# Patient Record
Sex: Male | Born: 1960 | Hispanic: Yes | Marital: Married | Smoking: Former smoker
Health system: Southern US, Community
[De-identification: ages and names within clinical notes are randomized; demographics above are authoritative.]

---

## 2012-11-20 ENCOUNTER — Encounter: Payer: Self-pay | Admitting: Family Medicine

## 2012-11-20 ENCOUNTER — Ambulatory Visit (INDEPENDENT_AMBULATORY_CARE_PROVIDER_SITE_OTHER): Payer: Commercial Managed Care - PPO | Admitting: Family Medicine

## 2012-11-20 VITALS — BP 150/90 | HR 60 | Ht 64.5 in | Wt 169.0 lb

## 2012-11-20 DIAGNOSIS — IMO0001 Reserved for inherently not codable concepts without codable children: Secondary | ICD-10-CM

## 2012-11-20 DIAGNOSIS — E119 Type 2 diabetes mellitus without complications: Secondary | ICD-10-CM

## 2012-11-20 DIAGNOSIS — E118 Type 2 diabetes mellitus with unspecified complications: Secondary | ICD-10-CM | POA: Insufficient documentation

## 2012-11-20 DIAGNOSIS — I10 Essential (primary) hypertension: Secondary | ICD-10-CM | POA: Insufficient documentation

## 2012-11-20 MED ORDER — HYDROCHLOROTHIAZIDE 25 MG PO TABS: 25.0000 mg | ORAL_TABLET | Freq: Every day | ORAL | Status: DC

## 2012-11-20 MED ORDER — LISINOPRIL 40 MG PO TABS: 40.0000 mg | ORAL_TABLET | Freq: Every day | ORAL | Status: DC

## 2012-11-20 MED ORDER — ASPIRIN 81 MG PO CHEW: 81.0000 mg | CHEWABLE_TABLET | Freq: Every day | ORAL | Status: AC

## 2012-11-20 MED ORDER — METFORMIN HCL 850 MG PO TABS: 850.0000 mg | ORAL_TABLET | Freq: Two times a day (BID) | ORAL | Status: DC

## 2012-11-20 NOTE — Patient Instructions (Addendum)
Fue un Pharmacist, community.   [facility] mas tarde en esta semana: le llamo al [phone] o le mando una carta con los [place].  Medicinas recetadas hoy: (vea listado).  Tambien le recomiendo que tome una aspirina 81mg  una vez por dia, para protegerle al corazon.   Quiero volver a verle en 3 meses.    RELEASE OF INFORMATION FROM OPHTHALMOLOGIST IN [place], ALSO FROM PRIMARY PHYSICIANS DR. Doloris Hall AND DR. ROSALIA VASQUEZ.

## 2012-11-20 NOTE — Assessment & Plan Note (Signed)
To check labs, (metabolic profile, lipids and CBC).  Patient for increased dose of lisinopril from 20mg  to 40mg /day.  Recheck BP in 2-4 weeks.

## 2012-11-20 NOTE — Progress Notes (Signed)
  Subjective:    Patient ID: Noah Davidson, male    DOB: 04-14-1960, 52 y.o.   MRN: [id number]  HPI Visit with this new patient conducted in [place].  Patient recently moved to [place] from [place], [place], where he was under care for DM2, HTN and was seen by his ophthalmologist every 4 months for follow up of what he believes are eye problems related to his DM.   Was treated with metformin, HCTZ, lisinopril.  Ran out of medications for over 1 month.    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  Has not been under the care of a cardiologist.  Does not believe he has ever had cardiac catheterization.  Had ETT 2 yrs ago in [place] and reports he never got the results of the study.   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  Quit cigarettes 30 yrs ago. Reports drinking 2-3 beers a day, with heavier amounts (he states 6 beers in a sitting) on the weekends.  Reports that he does drink Gatorade frequently during hot weather; 2-3 tortillas/day, rarely eats rice or bread.  Enjoys vegetables and fresh fruits and eats regularly.    Review of Systems  Denies fevers or chills; reports "aires" which he describes as momentary chest pains that are sharp and are relieved by belching.  No dyspnea, no cough, no sputum production.  No dysuria, no abd pain, no nausea or vomiting or changes in stool habits.      Objective:   Physical Exam Well appearing, no apparent distress HEENT Neck supple. No cervical adenopathy. Clear oropharynx.  COR Regular S1S2, no extra sounds PULM CLear bilaterally, no rales or wheezes FEET: No lesions noted. Monofilament testing intact in all areas.  Palpable DP pulses bilaterally. No oedema.        Assessment & Plan:

## 2012-11-20 NOTE — Assessment & Plan Note (Signed)
A1C today is 13.1%.  Has been off his DM meds for some time.  To restart meds, emphasized diet and activity; recheck A1C in 3 months and proceed from there.  Also,ROI from [facility] doctors, including his Ophthalmologist who has been treating him for what sounds like diabetic retinopathy.

## 2012-11-23 ENCOUNTER — Other Ambulatory Visit: Payer: Commercial Managed Care - PPO

## 2012-11-23 LAB — LIPID PANEL
Cholesterol: 201 mg/dL — ABNORMAL HIGH (ref 0–200)
HDL: 56 mg/dL (ref >39)
LDL Cholesterol: 125 mg/dL — ABNORMAL HIGH (ref 0–99)
Triglycerides: 101 mg/dL (ref <150)

## 2012-11-23 LAB — COMPREHENSIVE METABOLIC PANEL
ALT: 15 U/L (ref 0–53)
BUN: 12 mg/dL (ref 6–23)
CO2: 31 mEq/L (ref 19–32)
Calcium: 9.7 mg/dL (ref 8.4–10.5)
Creat: 0.76 mg/dL (ref 0.50–1.35)
Glucose, Bld: 306 mg/dL — ABNORMAL HIGH (ref 70–99)
Total Bilirubin: 0.4 mg/dL (ref 0.3–1.2)

## 2012-11-23 LAB — CBC
HCT: 41.6 % (ref 39.0–52.0)
Hemoglobin: 14.4 g/dL (ref 13.0–17.0)
MCV: 85.2 fL (ref 78.0–100.0)
RBC: 4.88 MIL/uL (ref 4.22–5.81)
WBC: 4.6 10*3/uL (ref 4.0–10.5)

## 2012-11-23 NOTE — Progress Notes (Signed)
CMP,CBC AND FLP DONE TODAY Noah Davidson 

## 2012-11-26 ENCOUNTER — Encounter: Payer: Self-pay | Admitting: Family Medicine

## 2013-03-07 ENCOUNTER — Encounter: Payer: Self-pay | Admitting: Family Medicine

## 2013-03-07 ENCOUNTER — Ambulatory Visit (INDEPENDENT_AMBULATORY_CARE_PROVIDER_SITE_OTHER): Payer: Commercial Managed Care - PPO | Admitting: Family Medicine

## 2013-03-07 ENCOUNTER — Ambulatory Visit (HOSPITAL_COMMUNITY)
Admission: RE | Admit: 2013-03-07 | Discharge: 2013-03-07 | Disposition: A | Discharge status: Home / Self Care | Payer: Commercial Managed Care - PPO | Source: Ambulatory Visit | Attending: Family Medicine | Admitting: Family Medicine

## 2013-03-07 VITALS — BP 147/91 | HR 85 | Temp 98.0°F | Wt 170.0 lb

## 2013-03-07 DIAGNOSIS — M545 Low back pain, unspecified: Secondary | ICD-10-CM | POA: Insufficient documentation

## 2013-03-07 DIAGNOSIS — M5137 Other intervertebral disc degeneration, lumbosacral region: Secondary | ICD-10-CM | POA: Insufficient documentation

## 2013-03-07 DIAGNOSIS — G8929 Other chronic pain: Secondary | ICD-10-CM | POA: Insufficient documentation

## 2013-03-07 DIAGNOSIS — M51379 Other intervertebral disc degeneration, lumbosacral region without mention of lumbar back pain or lower extremity pain: Secondary | ICD-10-CM | POA: Insufficient documentation

## 2013-03-07 MED ORDER — NAPROXEN 500 MG PO TABS: 500.0000 mg | ORAL_TABLET | Freq: Three times a day (TID) | ORAL | Status: AC

## 2013-03-07 NOTE — Patient Instructions (Signed)
I am sorry your back pain is worse as well as the numbness in the legs and sharp pains in the feet. This could be a combination from your low back as well as your diabetes causing the sharp pain in your feet.   I gave you a medicine to try to relieve your pain, do not take ibuprofen with it. You can also try heat or ice.  We sent you for an x-ray.  We are sending you to a chiropractor but you are welcome to find one on your own if you would like and have them call us if they need the referral. You can also ask for a copy of your x-ray to take to the chiropractor.   I would like you to check in with Dr. Mauricio PoBreen in the next few weeks. You need to also have a visit for your diabetes and high blood pressure. I am not sure if he received records but check with him at that visit.  Dr. Durene CalHunter

## 2013-03-07 NOTE — Assessment & Plan Note (Addendum)
Acute flare after some housework yesterday. No obvious red flags for emergent evaluation (precepted with Dr. Gwendolyn GrantWalden). No falls but got x-ray to evaluate for compression fracture and there was none. Pain likely due to degenerative changes as well as suspected disc herniation given worse with sitting. Advised patient to use naproxen and f/u with Dr. Mauricio PoBreen in the next few weeks. ALso gave referral to chiropractor to see if could have some relief from manipulation (advised patient he could take x-ray report with him). Hopeful Dr. Mauricio PoBreen received records as previously planned.  Will call with x-ray results. Also advised f/u with Dr. Mauricio PoBreen for chornic issues including DM and hypertension.

## 2013-03-07 NOTE — Progress Notes (Signed)
Noah Conch, MD Phone: [phone]  Subjective:  Chief complaint-noted. Same Day Visit.   Low Back Pain  Pain rating, quality, Location-low back pain (more of a burning) shooting into legs. Associated with numbness throughout lower leg as well as stabbing pain in both feet. This pain is similar to previous occurences. Pain from 5-7/10. Not able to sleep at night.  Started, duration-has had intermittent pain for over 20 years, most recently flared up/reinjured in November 2013 at work and then yesterday after some housework symptoms seemed to worsen again.  Worse with -sitting makes the pain worse including numbness symptoms. Or with prolonged standing.  Relieved by-states cannot find position to make it better.  Though holding himself up slightly with armrests seems to relieve very slightly the pressure. Has tried ibuprofen with minimal relief. Lyrica did not help much in the past.  Previous Treatment-Had surgery in 1995 on L5 in [place]. Had physical therapy and was released in May 2014-no improvement in pain with that. Chiropractor with minimal help but states was mainly lifting weights and no manipulation. Steroid injections had helped in the past.  Previous imaging-before he moved, not in our system  ROS-No saddle anesthesia, bladder incontinence, fecal incontinence. One time yesterday pain was so severe that felt like legs were going to give out. History negative for trauma or falls, history of cancer, fever, chills, unintentional weight loss, recent bacterial infection, recent IV drug use, HIV, pain worse at night or while supine.   Past Medical History Patient Active Problem List   Diagnosis Date Noted  . Chronic low back pain 03/07/2013  . Type II or unspecified type diabetes mellitus without mention of complication, uncontrolled 11/20/2012  . Essential hypertension, benign 11/20/2012   Surgical history- as above  Medications- reviewed and updated Current Outpatient  Prescriptions  Medication Sig Dispense Refill  . aspirin (ASPIRIN CHILDRENS) 81 MG chewable tablet Chew 1 tablet (81 mg total) by mouth daily.  100 tablet  4  . hydrochlorothiazide (HYDRODIURIL) 25 MG tablet Take 1 tablet (25 mg total) by mouth daily.  90 tablet  3  . lisinopril (PRINIVIL,ZESTRIL) 40 MG tablet Take 1 tablet (40 mg total) by mouth daily.  90 tablet  3  . metFORMIN (GLUCOPHAGE) 850 MG tablet Take 1 tablet (850 mg total) by mouth 2 (two) times daily with a meal.  180 tablet  3  . naproxen (NAPROSYN) 500 MG tablet Take 1 tablet (500 mg total) by mouth 3 (three) times daily with meals.  60 tablet  1   No current facility-administered medications for this visit.    Objective: BP 147/91  Pulse 85  Temp(Src) 98 F (36.7 C) (Oral)  Wt 170 lb (77.111 kg) Gen: NAD, resting comfortably in chair with arms propped on arm rests.  Ext: no edema Skin: warm, dry Neuro: grossly normal, moves all extremities, 5/5 strength in lower extremities.   Back - Normal skin, Spine with normal alignment and no deformity. Midline scar noted. Tenderness to vertebral process palpation around L4-L6. Marland Kitchen  Paraspinous muscles are tender but without spasm.   Range of motion is full at neck. Patient due to pain is unable to touch toes or give lumbar extension except minimally. Negative straight leg raise.   Dg Lumbar Spine Complete  03/07/2013   CLINICAL DATA:  Chronic low back pain.  EXAM: LUMBAR SPINE - COMPLETE 4+ VIEW  COMPARISON:  None.  FINDINGS: Normal alignment of the lumbar spine. The vertebral body heights are well preserved. Disc space narrowing  stress set there is ventral endplate spur stress set mild ventral endplate spurring is noted throughout the lumbar spine. There is moderate disc space narrowing at the L5-S1 level.  IMPRESSION: 1. No acute findings. 2. L5-S1 degenerative disc disease.   Electronically Signed   By: Signa Kellaylor  Stroud M.D.   On: 03/07/2013 13:16   Assessment/Plan:  Chronic low back  pain Acute flare after some housework yesterday. No obvious red flags for emergent evaluation (precepted with Dr. Gwendolyn GrantWalden). No falls but got x-ray to evaluate for compression fracture and there was none. Pain likely due to degenerative changes as well as suspected disc herniation given worse with sitting. Advised patient to use naproxen and f/u with Dr. Mauricio PoBreen in the next few weeks. ALso gave referral to chiropractor to see if could have some relief from manipulation (advised patient he could take x-ray report with him). Hopeful Dr. Mauricio PoBreen received records as previously planned.  Will call with x-ray results. Also advised f/u with Dr. Mauricio PoBreen for chornic issues including DM and hypertension.     Orders Placed This Encounter  Procedures  . DG Lumbar Spine Complete    Standing Status: Future     Number of Occurrences: 1     Standing Expiration Date: 05/06/2014    Order Specific Question:  Reason for Exam (SYMPTOM  OR DIAGNOSIS REQUIRED)    Answer:  chronic low back pain. History of surgery in [place].    Order Specific Question:  Preferred imaging location?    Answer:  [facility]  . Ambulatory referral to Chiropractic    Referral Priority:  Routine    Referral Type:  Chiropractic    Referral Reason:  Specialty Services Required    Requested Specialty:  Chiropractic Medicine    Number of Visits Requested:  1    Meds ordered this encounter  Medications  . naproxen (NAPROSYN) 500 MG tablet    Sig: Take 1 tablet (500 mg total) by mouth 3 (three) times daily with meals.    Dispense:  60 tablet    Refill:  1

## 2013-03-08 ENCOUNTER — Telehealth: Payer: Self-pay | Admitting: *Deleted

## 2013-03-08 NOTE — Telephone Encounter (Signed)
Message copied by Osborne OmanFLEEGER, JESSICA D on Fri Mar 08, 2013  3:55 PM ------      Message from: Shelva MajesticHUNTER, STEPHEN O      Created: Fri Mar 08, 2013  2:39 PM       Blue team-LVM for patient to return call. Please give message if calls back: degenerative disc disease on L5-S1 and hardware in place. No acute changes. F/u with Dr. Mauricio PoBreen as planned. ------

## 2013-03-08 NOTE — Telephone Encounter (Signed)
Will await callback.  Pt has appt on 03/19/13. Noah Davidson, Noah Davidson

## 2013-03-11 NOTE — Telephone Encounter (Signed)
Pt informed. Noah Davidson Dawn  

## 2013-03-19 ENCOUNTER — Ambulatory Visit (INDEPENDENT_AMBULATORY_CARE_PROVIDER_SITE_OTHER): Payer: Commercial Managed Care - PPO | Admitting: Family Medicine

## 2013-03-19 VITALS — BP 144/90 | HR 88 | Ht 64.5 in | Wt 172.2 lb

## 2013-03-19 DIAGNOSIS — G8929 Other chronic pain: Secondary | ICD-10-CM

## 2013-03-19 DIAGNOSIS — IMO0001 Reserved for inherently not codable concepts without codable children: Secondary | ICD-10-CM

## 2013-03-19 DIAGNOSIS — I10 Essential (primary) hypertension: Secondary | ICD-10-CM

## 2013-03-19 DIAGNOSIS — IMO0002 Reserved for concepts with insufficient information to code with codable children: Secondary | ICD-10-CM

## 2013-03-19 DIAGNOSIS — E1165 Type 2 diabetes mellitus with hyperglycemia: Principal | ICD-10-CM

## 2013-03-19 DIAGNOSIS — M545 Low back pain, unspecified: Secondary | ICD-10-CM

## 2013-03-19 DIAGNOSIS — M5416 Radiculopathy, lumbar region: Secondary | ICD-10-CM | POA: Insufficient documentation

## 2013-03-19 LAB — BASIC METABOLIC PANEL
BUN: 10 mg/dL (ref 6–23)
CO2: 29 meq/L (ref 19–32)
Calcium: 9.2 mg/dL (ref 8.4–10.5)
Chloride: 98 mEq/L (ref 96–112)
Creat: 0.7 mg/dL (ref 0.50–1.35)
GLUCOSE: 248 mg/dL — AB (ref 70–99)
POTASSIUM: 4 meq/L (ref 3.5–5.3)
Sodium: 136 mEq/L (ref 135–145)

## 2013-03-19 LAB — POCT GLYCOSYLATED HEMOGLOBIN (HGB A1C): Hemoglobin A1C: 8.5

## 2013-03-19 MED ORDER — GABAPENTIN 300 MG PO CAPS: 300.0000 mg | ORAL_CAPSULE | Freq: Three times a day (TID) | ORAL | Status: DC

## 2013-03-19 NOTE — Patient Instructions (Addendum)
Fue un Research officer, trade unionplacer verle hoy.  Estoy ordenando otro chequeo del Chief Financial Officerazucar y de la funcion renal hoy; Garment/textile technologistle contacto con los [place].   Estoy haciendo una orden para la resonancia magnetica de la espalda; estamos comenzando una nueva medicina GABAPENTIN 300mg , tome una capsula por boca cada noche por una semana, despues aumente a 1 capsula manana y noche, y en la tercera semana 1 capsula tres veces por dia (manana, mediodia y noche).  Quiero volver a verle entre 4 y 6 semanas para ver si esta' mejor del dolor en las piernas.   MAKE FOLLOW UP APPOINTMENT WITH DR Mauricio PoBREEN IN 4 TO 6 WEEKS.

## 2013-03-20 ENCOUNTER — Encounter: Payer: Self-pay | Admitting: Family Medicine

## 2013-03-20 ENCOUNTER — Telehealth: Payer: Self-pay | Admitting: Family Medicine

## 2013-03-20 NOTE — Assessment & Plan Note (Signed)
Plan to pursue additional imaging given patient's complex history of lumbar injury and surgery; will also start on gabapentin for element of neuropathic pain that may be partially DM related as well. Discussed escalating up on the gabapentin, follow up to reassess.
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
## 2013-03-20 NOTE — Assessment & Plan Note (Signed)
To reassess A1C at today's visit, decide on need for more aggressive medical therapy and continued dietary intervention.

## 2013-03-20 NOTE — Telephone Encounter (Signed)
I tried to call patient at the only number on chart (602 area code), no answer.  Routine results letter sent.  JB

## 2013-03-29 ENCOUNTER — Ambulatory Visit (HOSPITAL_COMMUNITY)
Admission: RE | Admit: 2013-03-29 | Discharge: 2013-03-29 | Disposition: A | Discharge status: Home / Self Care | Payer: Commercial Managed Care - PPO | Source: Ambulatory Visit | Attending: Family Medicine | Admitting: Family Medicine

## 2013-03-29 DIAGNOSIS — M5416 Radiculopathy, lumbar region: Secondary | ICD-10-CM

## 2013-03-29 DIAGNOSIS — M5137 Other intervertebral disc degeneration, lumbosacral region: Secondary | ICD-10-CM | POA: Insufficient documentation

## 2013-03-29 DIAGNOSIS — M51379 Other intervertebral disc degeneration, lumbosacral region without mention of lumbar back pain or lower extremity pain: Secondary | ICD-10-CM | POA: Insufficient documentation

## 2013-03-29 DIAGNOSIS — M5126 Other intervertebral disc displacement, lumbar region: Secondary | ICD-10-CM | POA: Insufficient documentation

## 2013-04-04 ENCOUNTER — Encounter: Payer: Self-pay | Admitting: Family Medicine

## 2013-04-09 ENCOUNTER — Ambulatory Visit (INDEPENDENT_AMBULATORY_CARE_PROVIDER_SITE_OTHER): Payer: Commercial Managed Care - PPO | Admitting: Family Medicine

## 2013-04-09 ENCOUNTER — Encounter: Payer: Self-pay | Admitting: Family Medicine

## 2013-04-09 VITALS — BP 134/80 | HR 80 | Ht 64.5 in | Wt 177.2 lb

## 2013-04-09 DIAGNOSIS — IMO0002 Reserved for concepts with insufficient information to code with codable children: Secondary | ICD-10-CM

## 2013-04-09 DIAGNOSIS — M545 Low back pain, unspecified: Secondary | ICD-10-CM

## 2013-04-09 DIAGNOSIS — M5416 Radiculopathy, lumbar region: Secondary | ICD-10-CM

## 2013-04-09 DIAGNOSIS — G8929 Other chronic pain: Secondary | ICD-10-CM

## 2013-04-09 MED ORDER — GABAPENTIN 300 MG PO CAPS: 600.0000 mg | ORAL_CAPSULE | Freq: Three times a day (TID) | ORAL | Status: DC

## 2013-04-09 NOTE — Patient Instructions (Signed)
Fue un Research officer, trade unionplacer verle hoy.  Como hablamos, estoy refiriendole a un neurocirujano.  En el mientrastanto, quiero que aumente la GABAPENTIN 300mg  a 2 capsulas tres veces por dia.   Quiero volver a verle en 2 a 4 semanas.  En esa visita vamos a hablar mas sobre la depresion.   FOLLOW UP WITH DR Mauricio PoBREEN IN 2 TO 4 WEEKS, BACK PAIN AND DEPRESSION.

## 2013-04-10 NOTE — Assessment & Plan Note (Signed)
Discussed findings of MRI, recommendation to see neurosurgery.  He has some improvement in pain symptoms with gabapentin, will increase dose to 600mg  tid and follow up in 4 weeks.  Plan to discuss depressive symptoms at that visit, as he and friend bring this up at the very end of this visit.
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
## 2013-05-07 ENCOUNTER — Ambulatory Visit: Payer: Commercial Managed Care - PPO | Admitting: Family Medicine

## 2013-05-10 ENCOUNTER — Ambulatory Visit (INDEPENDENT_AMBULATORY_CARE_PROVIDER_SITE_OTHER): Payer: Commercial Managed Care - PPO | Admitting: Family Medicine

## 2013-05-10 ENCOUNTER — Encounter: Payer: Self-pay | Admitting: Family Medicine

## 2013-05-10 VITALS — BP 140/90 | HR 74 | Temp 98.6°F | Ht 64.5 in | Wt 178.3 lb

## 2013-05-10 DIAGNOSIS — IMO0001 Reserved for inherently not codable concepts without codable children: Secondary | ICD-10-CM

## 2013-05-10 DIAGNOSIS — E1169 Type 2 diabetes mellitus with other specified complication: Secondary | ICD-10-CM

## 2013-05-10 DIAGNOSIS — M545 Low back pain, unspecified: Secondary | ICD-10-CM

## 2013-05-10 DIAGNOSIS — G8929 Other chronic pain: Secondary | ICD-10-CM

## 2013-05-10 DIAGNOSIS — E1165 Type 2 diabetes mellitus with hyperglycemia: Secondary | ICD-10-CM

## 2013-05-10 DIAGNOSIS — L255 Unspecified contact dermatitis due to plants, except food: Secondary | ICD-10-CM

## 2013-05-10 DIAGNOSIS — N521 Erectile dysfunction due to diseases classified elsewhere: Secondary | ICD-10-CM

## 2013-05-10 DIAGNOSIS — N529 Male erectile dysfunction, unspecified: Secondary | ICD-10-CM

## 2013-05-10 DIAGNOSIS — L237 Allergic contact dermatitis due to plants, except food: Secondary | ICD-10-CM

## 2013-05-10 MED ORDER — SILDENAFIL CITRATE 20 MG PO TABS: 20.0000 mg | ORAL_TABLET | Freq: Every day | ORAL | Status: DC | PRN

## 2013-05-10 MED ORDER — GABAPENTIN 300 MG PO CAPS: 900.0000 mg | ORAL_CAPSULE | Freq: Three times a day (TID) | ORAL | Status: DC

## 2013-05-10 MED ORDER — TRIAMCINOLONE ACETONIDE 0.1 % EX CREA: 1.0000 "application " | TOPICAL_CREAM | Freq: Two times a day (BID) | CUTANEOUS | Status: DC

## 2013-05-10 NOTE — Patient Instructions (Signed)
Fue un Research officer, trade unionplacer verle hoy.    1. Aumentamos la dosis de la gabapentin a 900mg , tres veces por dia (3 capsulas cada dosis).  2. Para la hiedra venenosa, Kenalog pomada 2 veces por dia.  3. Para la disfuncion sexual, sildenafil 20mg , media-hora antes de cuando anticipa la actividad sexual.   Noah NevinsQuiero volver a verle en 1-2 meses, despues de su proxima visita al cirujano.

## 2013-05-10 NOTE — Assessment & Plan Note (Signed)
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  No diagnosis of CVD; trial of low-dose sildenafil and follow up in 1-2 month. Discussed side effects that shouuld prompt immediate discontinuation. Not on an alpha blocker or nitrate.

## 2013-05-10 NOTE — Progress Notes (Signed)
   Subjective:    Patient ID: Noah Davidson, male    DOB: 1960-10-28, 53 y.o.   MRN: [id number]  HPI Visit in Spanish. Patient for follow up of a few items, plus 2 new complaints.  Went to neurosurgery, I received a letter from them and plan for more conservative measures.  The 6-day steroid (oral) taper was helpful in the short-term; the pain in the low back and legs has resumed and inhibits his sleep.  He believes the gabapentin has helped somewhat, still with significant "hormigueo" (tingling) over the legs and to tips of all 10 toes.  No re-injury.  Was in the yard on Sunday, March 8th and had eruption along the medial aspect of upper L arm.  Not sure how he got it, suspected it was from biting insects.  No fevers/chills, no home remedies to this point.   Reports difficulty achieving erection over the past 7-8 months.  Absence of morning spontaneous erection that he used to have when he would wake up.  Sexually active with male partner, relationship is good and doesn't believe there's a mental or emotional component.  No other meds than those on his med list.  ROS: No chest pain or pressure, no dyspnea, no history of CVD in the past.      Review of Systems     Objective:   Physical Exam Well appearing, no apparent distress HEENT Neck supple.  COR Regular S1S2, no extra sounds MSK: No edema in ankles or feet; palpable dp pulses. Sensation with monofilament is intact and symmetric in both feet. Brisk cap refill in all toes. NO skin changes or maceration between toes. Able to stand on heels and toes without difficulty. Patellar DTRs 1+ symmetrically.        Assessment & Plan:

## 2013-05-10 NOTE — Assessment & Plan Note (Signed)
On exam, red macules that streak across arm, with large area along medial aspect of R arm that has been excoriated and is very erythematous.  No fluctuance or purulence.  Clinically consistent with poison ivy, to use topical steroids for 1 week and report if worsening, painful or purulence.

## 2013-05-10 NOTE — Assessment & Plan Note (Signed)
Improved control at last visit. For A1C again at next visit in 1-2 months. Had flu shot this season.

## 2013-05-13 ENCOUNTER — Other Ambulatory Visit: Payer: Self-pay | Admitting: *Deleted

## 2013-05-13 DIAGNOSIS — E1169 Type 2 diabetes mellitus with other specified complication: Secondary | ICD-10-CM

## 2013-05-13 DIAGNOSIS — N521 Erectile dysfunction due to diseases classified elsewhere: Principal | ICD-10-CM

## 2013-05-13 MED ORDER — SILDENAFIL CITRATE 20 MG PO TABS: 20.0000 mg | ORAL_TABLET | Freq: Every day | ORAL | Status: DC | PRN

## 2013-06-18 ENCOUNTER — Ambulatory Visit: Payer: Commercial Managed Care - PPO | Admitting: Family Medicine

## 2013-07-02 ENCOUNTER — Ambulatory Visit (INDEPENDENT_AMBULATORY_CARE_PROVIDER_SITE_OTHER): Payer: Commercial Managed Care - PPO | Admitting: Family Medicine

## 2013-07-02 ENCOUNTER — Encounter: Payer: Self-pay | Admitting: Family Medicine

## 2013-07-02 VITALS — BP 152/96 | HR 76 | Ht 64.5 in | Wt 178.5 lb

## 2013-07-02 DIAGNOSIS — N521 Erectile dysfunction due to diseases classified elsewhere: Secondary | ICD-10-CM

## 2013-07-02 DIAGNOSIS — E1169 Type 2 diabetes mellitus with other specified complication: Secondary | ICD-10-CM

## 2013-07-02 DIAGNOSIS — E1165 Type 2 diabetes mellitus with hyperglycemia: Principal | ICD-10-CM

## 2013-07-02 DIAGNOSIS — M25511 Pain in right shoulder: Secondary | ICD-10-CM | POA: Insufficient documentation

## 2013-07-02 DIAGNOSIS — M25512 Pain in left shoulder: Secondary | ICD-10-CM

## 2013-07-02 DIAGNOSIS — R059 Cough, unspecified: Secondary | ICD-10-CM

## 2013-07-02 DIAGNOSIS — I10 Essential (primary) hypertension: Secondary | ICD-10-CM

## 2013-07-02 DIAGNOSIS — R05 Cough: Secondary | ICD-10-CM

## 2013-07-02 DIAGNOSIS — IMO0001 Reserved for inherently not codable concepts without codable children: Secondary | ICD-10-CM

## 2013-07-02 DIAGNOSIS — G8929 Other chronic pain: Secondary | ICD-10-CM

## 2013-07-02 DIAGNOSIS — N529 Male erectile dysfunction, unspecified: Secondary | ICD-10-CM

## 2013-07-02 DIAGNOSIS — M545 Low back pain, unspecified: Secondary | ICD-10-CM

## 2013-07-02 DIAGNOSIS — M25519 Pain in unspecified shoulder: Secondary | ICD-10-CM

## 2013-07-02 LAB — POCT GLYCOSYLATED HEMOGLOBIN (HGB A1C): Hemoglobin A1C: 7.4

## 2013-07-02 MED ORDER — AMLODIPINE BESYLATE 5 MG PO TABS: 5.0000 mg | ORAL_TABLET | Freq: Every day | ORAL | Status: DC

## 2013-07-02 MED ORDER — GABAPENTIN 300 MG PO CAPS: 900.0000 mg | ORAL_CAPSULE | Freq: Three times a day (TID) | ORAL | Status: DC

## 2013-07-02 NOTE — Patient Instructions (Addendum)
Fue un Research officer, trade unionplacer verle hoy.   Para la presion arterial alta, le estoy agregando otra medicina para la presion:  AMLODIPINE 5mg , una tableta por la manana, JUNTA CON LA LISINOPRIL y la HCTZ.  Aspirina 81mg  diario.  Para el hormigueo de las manos y los pies, tome la GABAPENTIN 300mg , UNA CAPSULA TRES VECES POR DIA.  Es importante seguimento con el oftalmologo para los cambios de la vista.   Para la tos, una placa (tambien de los hombros)  Tourist information centre managerQuiero volver a verle en 4 semanas; podemos considerar la opcion de inyectarle los hombros en esa visita.   FOLLOW UP WITH DR Graysen Woodyard 4 TO 5 WEEKS; SHOULDER INJECTION.

## 2013-07-03 NOTE — Assessment & Plan Note (Signed)
Suspect impingement, related to work.  Exam with pain with internal rotation more in R shoulder than L.  Xrays, consider subacromial inj afterward.

## 2013-07-03 NOTE — Assessment & Plan Note (Signed)
Not well controlled on HCTZ and lisinopril.  Addition of CCB to regimen, recheck in 2-4 weeks.

## 2013-07-03 NOTE — Assessment & Plan Note (Signed)
Tried Revatio one time; did not work.  Not taking. Discontinued from med list.

## 2013-07-03 NOTE — Progress Notes (Signed)
   Subjective:    Patient ID: Noah Davidson, male    DOB: 03-Nov-1960, 53 y.o.   MRN: [id number]  HPI Visit conducted in Spanish; here for follow up of many issues:  1. 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  A1C today has come down considerably (to 7.4%, down from 8.5% in Jan).  Has continued with pins and needles sensation in feet; taking gabapentin 300mg  twice daily.  Also some tingling in hands bilaterally.   2. HTN_ not well controlled, on lisinopril and HCTZ. Taking these daily, did not take yet today. Denies chest pain or pressure, no dyspnea.  Former smoker (quit in the distant past).   3. 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  No episode of traumatic injury.     Review of Systems     Objective:   Physical Exam Well appearing, no apparent distress HEENT Neck supple, no cervical adenopathy.  MSK: Positive Neer/Hawkins tests on R shoulder.  Handgrip symmetric and full; negative tinnels testing bilat wrists.  COR Regular S1S2 PULM Clear bilaterally, no rales or wheezes EXTS: no edema; callous along base of both great toes       Assessment & Plan:

## 2013-07-03 NOTE — Assessment & Plan Note (Addendum)
Improved  Control in the past 4 months. Continue current management.  Of note, patient reports worsening blurred vision, had gone to ophthalmology and had laser treatment. Has not gone back because he owes a bill of copay that is not covered by insurance and which he cannot pay now. He is advised to return to ophthalmology for further assessment and treatment of his blurred vision, which most likely is a manifestation of his retinopathy, DM related.

## 2013-07-25 ENCOUNTER — Other Ambulatory Visit: Payer: Self-pay | Admitting: Family Medicine

## 2013-07-30 ENCOUNTER — Ambulatory Visit (INDEPENDENT_AMBULATORY_CARE_PROVIDER_SITE_OTHER): Payer: Commercial Managed Care - PPO | Admitting: Family Medicine

## 2013-07-30 ENCOUNTER — Encounter: Payer: Self-pay | Admitting: Family Medicine

## 2013-07-30 VITALS — BP 150/96 | HR 82 | Ht 64.5 in | Wt 182.0 lb

## 2013-07-30 DIAGNOSIS — M25511 Pain in right shoulder: Secondary | ICD-10-CM

## 2013-07-30 DIAGNOSIS — M25519 Pain in unspecified shoulder: Secondary | ICD-10-CM

## 2013-07-30 DIAGNOSIS — M25512 Pain in left shoulder: Principal | ICD-10-CM

## 2013-07-30 MED ORDER — METHYLPREDNISOLONE ACETATE 40 MG/ML IJ SUSP
40.0000 mg | Freq: Once | INTRAMUSCULAR | Status: AC
  Administered 2013-07-30: 40 mg via INTRA_ARTICULAR

## 2013-07-30 NOTE — Patient Instructions (Signed)
Fue un Research officer, trade union.  Si el dolor del hombro no se mejora despues de la inyeccion de [place], quiero que vaya a Development worker, community las placas que ordene' en su ultima cita.   Quiero volver a verle al comienzo de Agosto para la presion y Neurosurgeon.   FOLLOW UP BEGINNING OF AUGUST FOR DM; DR Mauricio Po

## 2013-07-31 NOTE — Progress Notes (Signed)
   Subjective:    Patient ID: Noah Davidson, male    DOB: May 25, 1960, 53 y.o.   MRN: [id number]  HPI Visit in Spanish. Patient continues with bilateral shoulder pain, worse on RIGHT than left.  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   He did not have the x-rays done as ordered at last visit.   Review of Systems     Objective:   Physical Exam Generally well-appearing, no apparent distress hEENT NEck supple,  MSK: limited active ROM of RIGHT shoulder over head, internal rotation also limited (positive Neer and Hawkins tests on RIGHT).  Sensation and radial pulses in both hands/wrists are full and symmetric.         Assessment & Plan:

## 2013-07-31 NOTE — Assessment & Plan Note (Signed)
RIGHT SHOULDER SUBACROMIAL INJECTION PROCEDURE NOTE:  Risks and benefits of RIGHT shoulder injection discussed with patient, given opportunity to ask questions.  Patient gives verbal and written consent to proceed with RIGHT shoulder subacromial injection.  Patient shoulder prepped in clean fashion.  TIME OUT TAKEN. Patient injection with 2cc solumedrol and 3cc lidocaine WITHOUT EPINEPHRINE, posterior approach. Immediate improvement in symptoms with improved active ROM over head.  Tolerated well. Post-injection instructions given.

## 2013-09-01 ENCOUNTER — Other Ambulatory Visit: Payer: Self-pay | Admitting: Family Medicine

## 2013-12-20 ENCOUNTER — Encounter: Payer: Self-pay | Admitting: Family Medicine

## 2013-12-20 ENCOUNTER — Ambulatory Visit (INDEPENDENT_AMBULATORY_CARE_PROVIDER_SITE_OTHER): Payer: Self-pay | Admitting: Family Medicine

## 2013-12-20 VITALS — BP 155/96 | HR 76 | Temp 97.9°F | Ht 64.5 in | Wt 182.2 lb

## 2013-12-20 DIAGNOSIS — M545 Low back pain: Secondary | ICD-10-CM

## 2013-12-20 DIAGNOSIS — G8929 Other chronic pain: Secondary | ICD-10-CM

## 2013-12-20 DIAGNOSIS — I10 Essential (primary) hypertension: Secondary | ICD-10-CM

## 2013-12-20 DIAGNOSIS — E118 Type 2 diabetes mellitus with unspecified complications: Secondary | ICD-10-CM

## 2013-12-20 DIAGNOSIS — E119 Type 2 diabetes mellitus without complications: Secondary | ICD-10-CM

## 2013-12-20 LAB — LIPID PANEL
Cholesterol: 168 mg/dL (ref 0–200)
HDL: 68 mg/dL (ref >39)
LDL Cholesterol: 87 mg/dL (ref 0–99)
Total CHOL/HDL Ratio: 2.5 Ratio
Triglycerides: 67 mg/dL (ref <150)
VLDL: 13 mg/dL (ref 0–40)

## 2013-12-20 LAB — BASIC METABOLIC PANEL
BUN: 10 mg/dL (ref 6–23)
CHLORIDE: 103 meq/L (ref 96–112)
CO2: 26 meq/L (ref 19–32)
CREATININE: 0.68 mg/dL (ref 0.50–1.35)
Calcium: 9.2 mg/dL (ref 8.4–10.5)
Glucose, Bld: 100 mg/dL — ABNORMAL HIGH (ref 70–99)
Potassium: 4 mEq/L (ref 3.5–5.3)
Sodium: 138 mEq/L (ref 135–145)

## 2013-12-20 LAB — POCT GLYCOSYLATED HEMOGLOBIN (HGB A1C): Hemoglobin A1C: 6.5

## 2013-12-20 MED ORDER — METFORMIN HCL 850 MG PO TABS: 850.0000 mg | ORAL_TABLET | Freq: Two times a day (BID) | ORAL | Status: AC

## 2013-12-20 MED ORDER — HYDROCHLOROTHIAZIDE 25 MG PO TABS: ORAL_TABLET | ORAL | Status: AC

## 2013-12-20 MED ORDER — GABAPENTIN 300 MG PO CAPS: 900.0000 mg | ORAL_CAPSULE | Freq: Three times a day (TID) | ORAL | Status: AC

## 2013-12-20 MED ORDER — AMLODIPINE BESYLATE 10 MG PO TABS: 10.0000 mg | ORAL_TABLET | Freq: Every day | ORAL | Status: AC

## 2013-12-20 MED ORDER — LISINOPRIL 40 MG PO TABS: 40.0000 mg | ORAL_TABLET | Freq: Every day | ORAL | Status: AC

## 2013-12-20 NOTE — Progress Notes (Signed)
Addendum to previous CSW message:  Pt's appointment with [facility] is scheduled for January 09, 2014 at Hardie [place]ack[place]9am.  Micayla Brathwaite, MSW, LCSW [phone]

## 2013-12-20 NOTE — Patient Instructions (Addendum)
Fue un Research officer, trade unionplacer verle hoy.  Me alegro que la diabetes esta' tan bien controlado en la prueba de hoy (hemoglobina glicosilada de 6.5%)  Siga tomando la aspirinita una vez diaria.  Las demas medicinas las mande' a [facility] hoy por 3 meses.   La presion esta' alta hoy; aumente' la dosis de la amlodipine de 5 a 10mg  una vez por dia.  Si tiene tabletas de 5mg  en la casa, puede tomar 2 juntas por dia hasta que se acaben.  LA NUEVA RECETA DE HOY ES PARA AMLODIPINE 10MG .   Conley RollsLe llamo al [phone] con los resultados de los laboratorios que hicimos hoy.   Para seguro medico,  [email or web address].   Quiero volver a verle en 3 meses.

## 2013-12-20 NOTE — Progress Notes (Signed)
CSW attempted to reach pt on his home and mobile number. Someone speaking Spanish answered said the home number listed however stated it was the wrong number. CSW left a message on the mobile number listed but no pt information was left.  Pt has been scheduled for an appointment with the [facility] Rep for January 06, 2014 at 9am. CSW will attempt to reach pt next week.  Theresia BoughNorma Laurel Harnden, MSW, LCSW [phone]

## 2013-12-20 NOTE — Assessment & Plan Note (Signed)
Increase in his amlodipine dose from 5 to 10mg  daily.  Recheck BP and consideration about adding agent.

## 2013-12-20 NOTE — Progress Notes (Signed)
   Subjective:    Patient ID: Noah Davidson, male    DOB: 03-21-1960, 53 y.o.   MRN: [id number]  HPI Visit in Spanish. Patient reports feeling well.  Here for recheck of DM.  Has been eating less meat; more tacos, beans and [place] peppers. 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 No longer with insurance.   He reports that the one big change he's made is a decrease in his beer consumption. Used to drink 18 beers/day, now just one beer daily.  Quit cigarettes over 30 years ago.   Reports that he still has some tingling in feet, better than before. Has been worse when he stopped the gabapentin for a brief while.   ROS" denies chest pain or shortness of breath, no cough, no fevers/chills. Had colon cancer screening with colonoscopy in [place] in the middle of 2013 before moving to [place], was given the result that it was negative. Wears heavy boots and occasionally will have his toenails get black and fall off.   Review of Systems     Objective:   Physical Exam Well appearing, no apparent distress HEENT neck supple, no cervical adenopathy. MMM.  COR Regular S1S2, no extra sounds PULM Clear bilaterally, no rales or wheezes EXTS No areas of abrasion or opening in the skin. R great toenail with subungual ecchymosis visible. Sensation in all areas intact with monofilament testing. Palpable dp pulses bilaterally.         Assessment & Plan:

## 2013-12-20 NOTE — Assessment & Plan Note (Signed)
Patient with improvement in DM control.  Some sxs of neuropathy, DM related. Continue with metformin, as well as reduction/elimination of beer intake. ASA daily. Patient is advised of benefits of statins for reduction of CVD risk, decision to recheck lipids and consider based on 10-year risk calculator after most recent values are back.

## 2013-12-30 ENCOUNTER — Telehealth: Payer: Self-pay | Admitting: Family Medicine

## 2013-12-30 MED ORDER — SIMVASTATIN 20 MG PO TABS: 20.0000 mg | ORAL_TABLET | Freq: Every day | ORAL | Status: AC

## 2013-12-30 NOTE — Telephone Encounter (Signed)
Called patient to inform of lipid results and recommendation to initiate statin.  He is unaware of appointment with [facility] representative for Nov 9th at 9am as per CSW note.  I informed him that I would send a prescription for simvastatin 20mg  at bedtime, [facility] [address].  I will forward note to CSW so that she knows that patient has not [facility].  Paula ComptonJames Dmario Russom, MD

## 2013-12-31 ENCOUNTER — Encounter: Payer: Commercial Managed Care - PPO | Admitting: Family Medicine

## 2014-01-01 NOTE — Telephone Encounter (Signed)
CSW was unable to make contact with pt a few weeks ago to inform him of his appoint on 11/12 with the [facility] rep. CS[place] called [place] today [place]. CSW requested that pt contact CSW when he returns so that he can be scheduled for another appointment. Pt appreciative and agreeable.  Theresia BoughNorma Shelvy Perazzo, MSW, LCSW [phone]

## 2014-01-09 ENCOUNTER — Ambulatory Visit: Payer: Commercial Managed Care - PPO

## 2014-09-25 IMAGING — CR DG LUMBAR SPINE COMPLETE 4+V
5 series · 5 of 5 positions shown · non-contrast
Comparison: None.

CLINICAL DATA: Chronic low back pain.

EXAM:
LUMBAR SPINE - COMPLETE 4+ VIEW

[t lumbar spine ap]
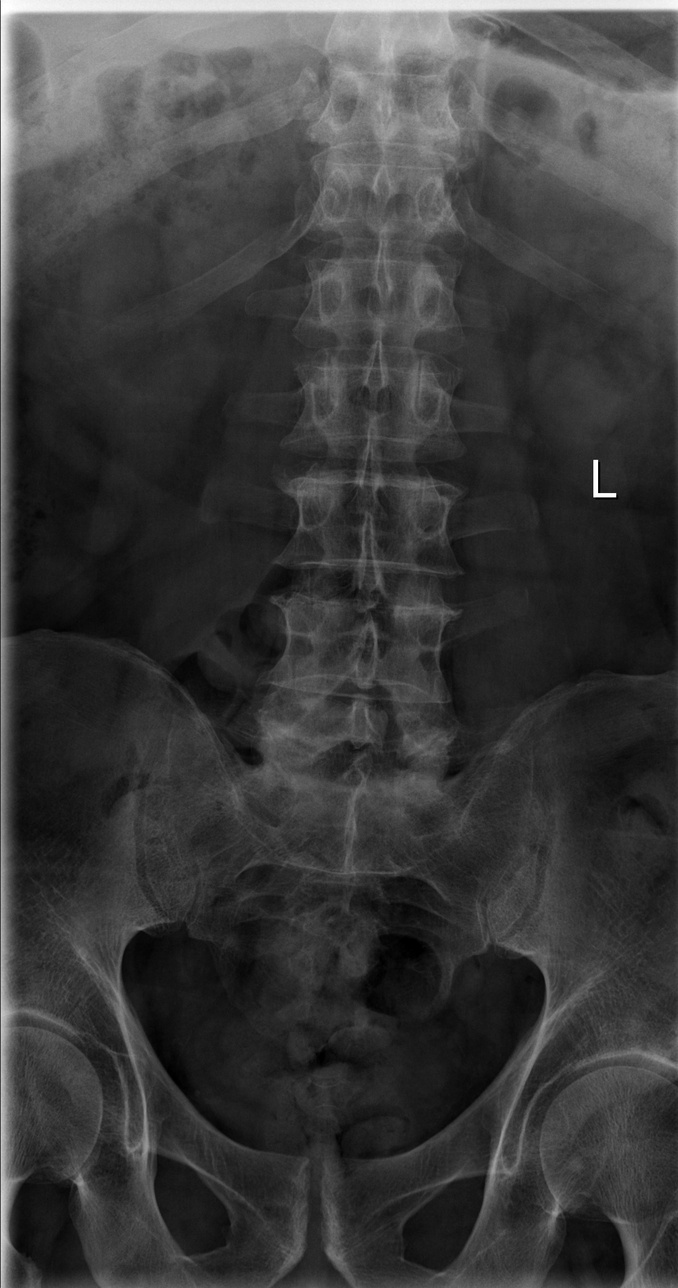

[t lumbar spine obl (1 of 2)]
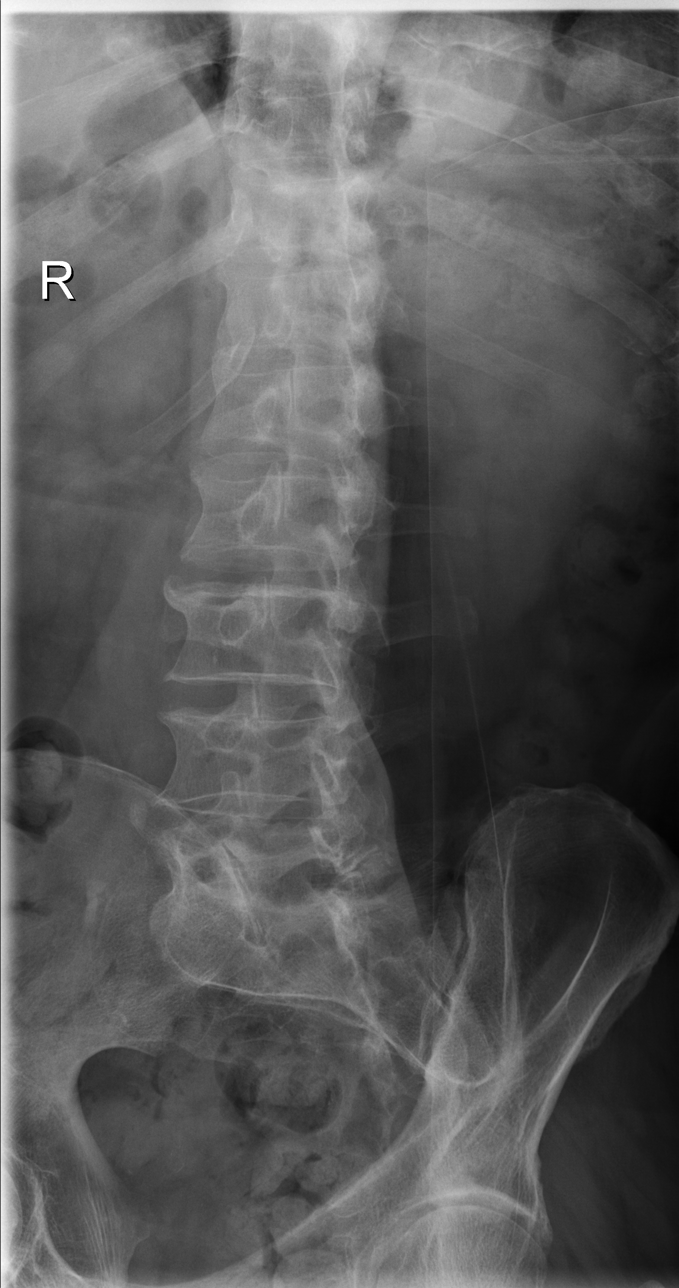

[t lumbar spine obl (2 of 2)]
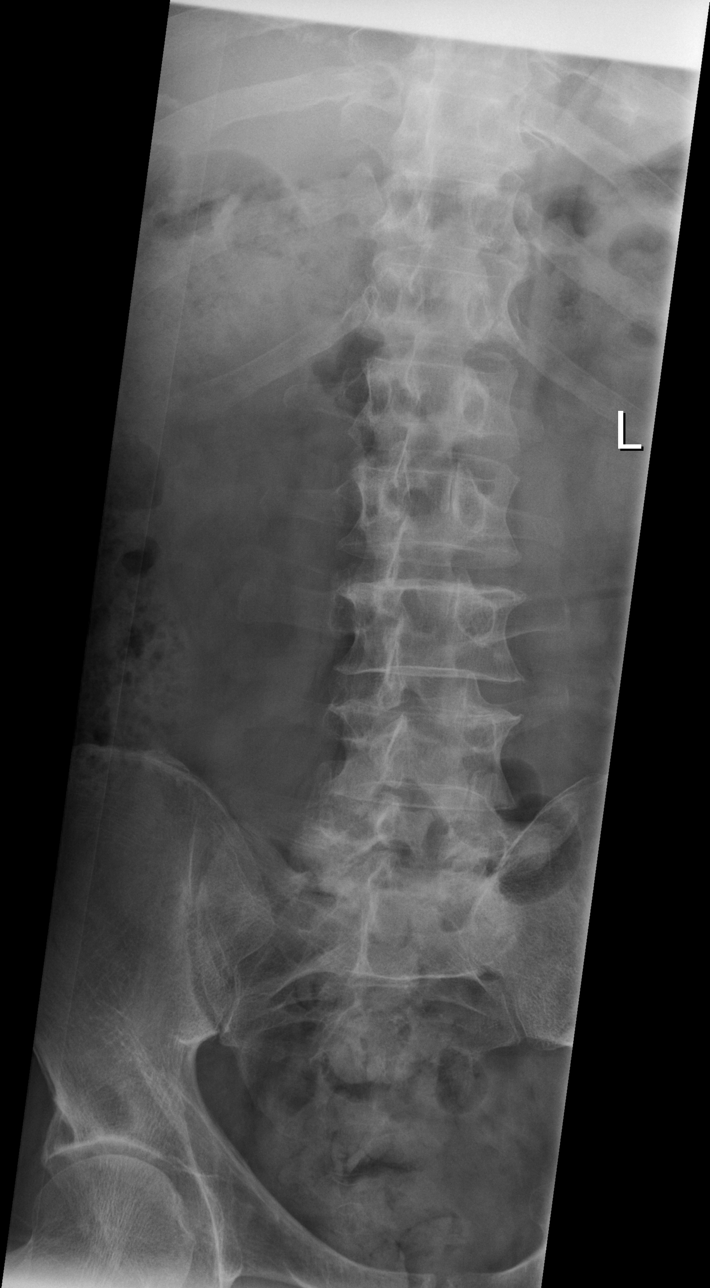

[t lumbar spine lat]
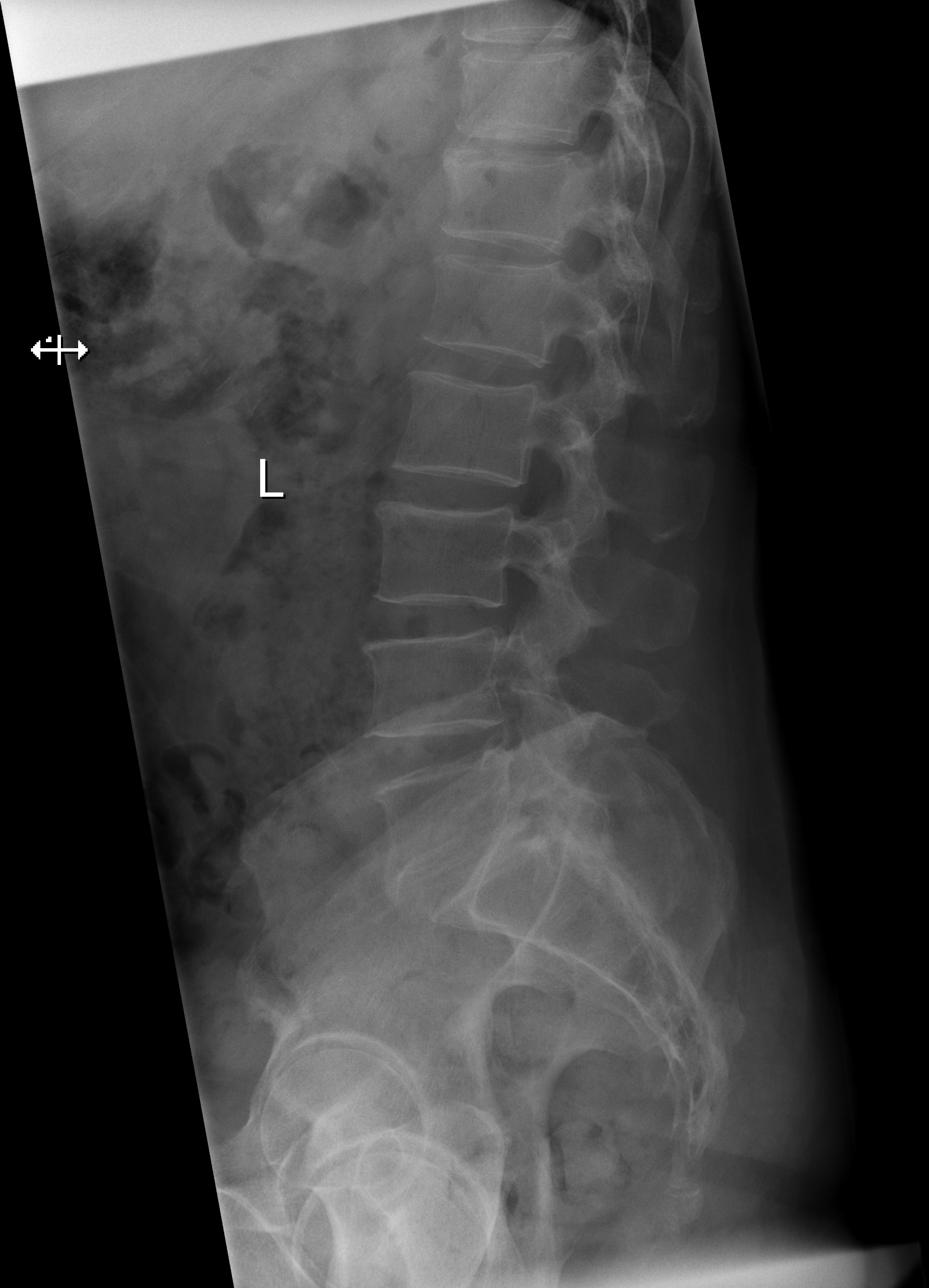

[t lumbar l-5 s-1 spot]
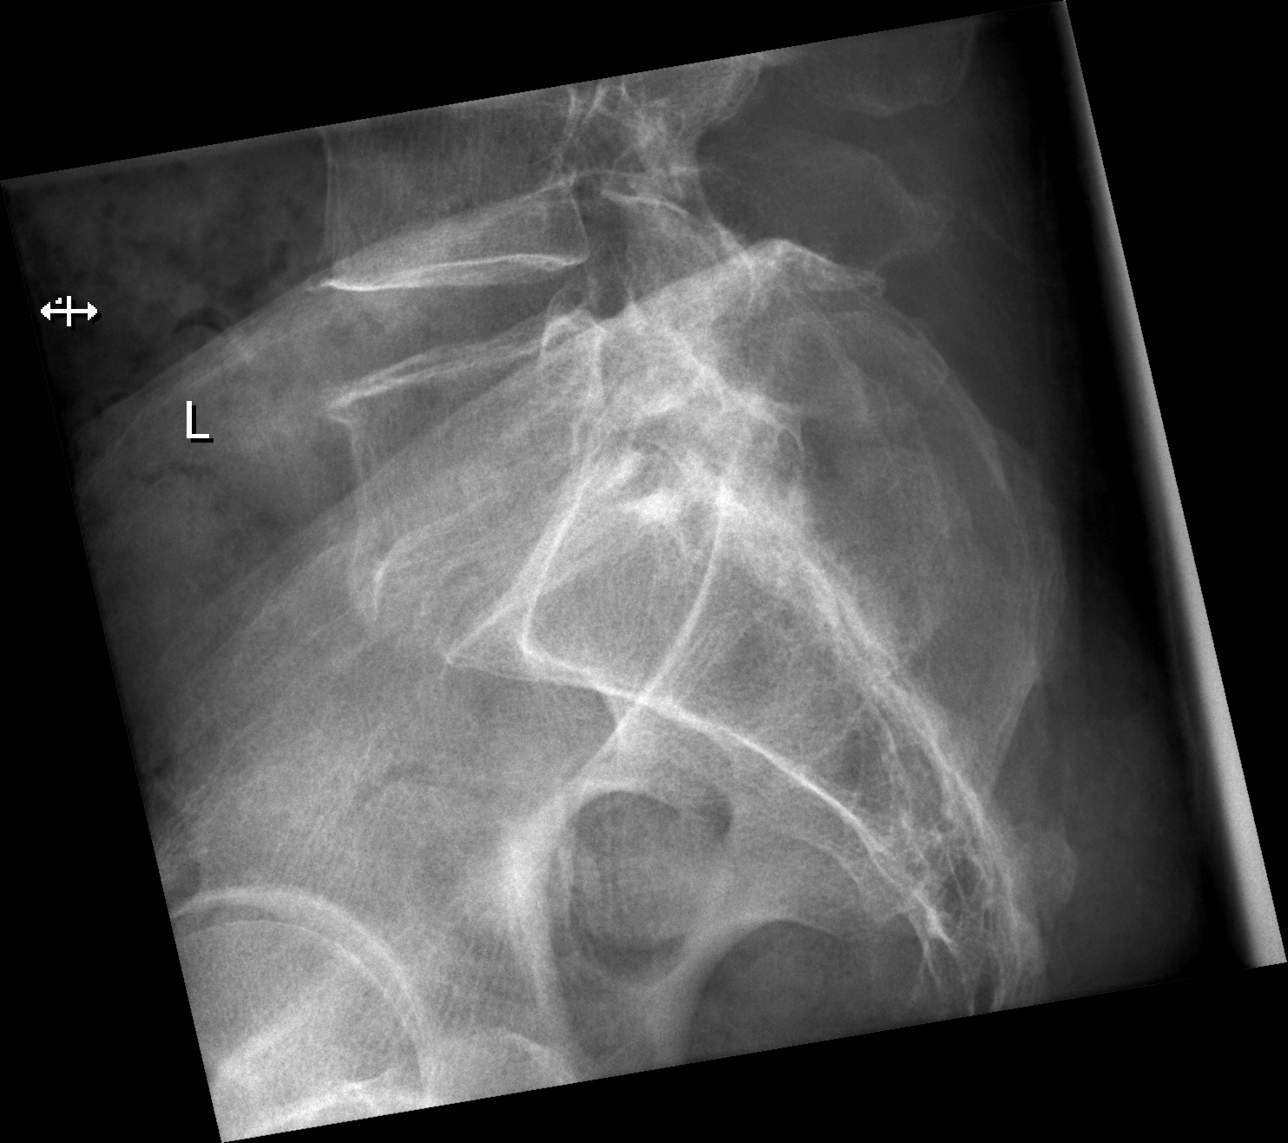

[5 of 5 positions shown; findings below may reference images not displayed]

FINDINGS: Normal alignment of the lumbar spine. The vertebral body heights are
well preserved. Disc space narrowing stress set there is ventral
endplate spur stress set mild ventral endplate spurring is noted
throughout the lumbar spine. There is moderate disc space narrowing
at the L5-S1 level.
IMPRESSION: 1. No acute findings.
2. L5-S1 degenerative disc disease.
